# Patient Record
Sex: Male | Born: 1970 | Race: Black or African American | Hispanic: No | Marital: Married | Smoking: Current every day smoker
Health system: Southern US, Community
[De-identification: ages and names within clinical notes are randomized; demographics above are authoritative.]

## PROBLEM LIST (undated history)

## (undated) DIAGNOSIS — M549 Dorsalgia, unspecified: Secondary | ICD-10-CM

## (undated) DIAGNOSIS — G8929 Other chronic pain: Secondary | ICD-10-CM

## (undated) HISTORY — PX: SHOULDER OPEN ROTATOR CUFF REPAIR: SHX2407

## (undated) HISTORY — PX: OTHER SURGICAL HISTORY: SHX169

## (undated) HISTORY — PX: WRIST SURGERY: SHX841

---

## 2010-04-23 ENCOUNTER — Ambulatory Visit: Payer: Self-pay | Admitting: Thoracic Surgery

## 2010-05-14 ENCOUNTER — Inpatient Hospital Stay (HOSPITAL_COMMUNITY)
Admission: RE | Admit: 2010-05-14 | Discharge: 2010-05-21 | Payer: Self-pay | Source: Home / Self Care | Attending: Neurosurgery | Admitting: Neurosurgery

## 2010-05-20 LAB — COMPREHENSIVE METABOLIC PANEL
ALT: 19 U/L (ref 0–53)
ALT: 33 U/L (ref 0–53)
AST: 23 U/L (ref 0–37)
AST: 79 U/L — ABNORMAL HIGH (ref 0–37)
Albumin: 4.2 g/dL (ref 3.5–5.2)
Albumin: 2.4 g/dL — ABNORMAL LOW (ref 3.5–5.2)
Alkaline Phosphatase: 59 U/L (ref 39–117)
Alkaline Phosphatase: 36 U/L — ABNORMAL LOW (ref 39–117)
BUN: 12 mg/dL (ref 6–23)
BUN: 9 mg/dL (ref 6–23)
CO2: 25 mEq/L (ref 19–32)
CO2: 29 mEq/L (ref 19–32)
Calcium: 9.3 mg/dL (ref 8.4–10.5)
Calcium: 8.4 mg/dL (ref 8.4–10.5)
Chloride: 105 mEq/L (ref 96–112)
Chloride: 96 mEq/L (ref 96–112)
Creatinine, Ser: 1.08 mg/dL (ref 0.4–1.5)
Creatinine, Ser: 0.92 mg/dL (ref 0.4–1.5)
GFR calc Af Amer: >60 mL/min (ref >60)
GFR calc Af Amer: >60 mL/min (ref >60)
GFR calc non Af Amer: >60 mL/min (ref >60)
GFR calc non Af Amer: >60 mL/min (ref >60)
Glucose, Bld: 93 mg/dL (ref 70–99)
Glucose, Bld: 118 mg/dL — ABNORMAL HIGH (ref 70–99)
Potassium: 4.1 mEq/L (ref 3.5–5.1)
Potassium: 4.6 mEq/L (ref 3.5–5.1)
Sodium: 136 mEq/L (ref 135–145)
Sodium: 132 mEq/L — ABNORMAL LOW (ref 135–145)
Total Bilirubin: 0.5 mg/dL (ref 0.3–1.2)
Total Bilirubin: 0.5 mg/dL (ref 0.3–1.2)
Total Protein: 6 g/dL (ref 6.0–8.3)
Total Protein: 4.7 g/dL — ABNORMAL LOW (ref 6.0–8.3)

## 2010-05-20 LAB — PROTIME-INR
INR: 1.09 (ref 0.00–1.49)
INR: 1.31 (ref 0.00–1.49)
INR: 1.12 (ref 0.00–1.49)
Prothrombin Time: 14.3 seconds (ref 11.6–15.2)
Prothrombin Time: 16.5 seconds — ABNORMAL HIGH (ref 11.6–15.2)
Prothrombin Time: 14.6 seconds (ref 11.6–15.2)

## 2010-05-20 LAB — CBC
HCT: 42.9 % (ref 39.0–52.0)
HCT: 26.9 % — ABNORMAL LOW (ref 39.0–52.0)
HCT: 20.5 % — ABNORMAL LOW (ref 39.0–52.0)
HCT: 23.8 % — ABNORMAL LOW (ref 39.0–52.0)
HCT: 26.3 % — ABNORMAL LOW (ref 39.0–52.0)
HCT: 26.3 % — ABNORMAL LOW (ref 39.0–52.0)
HCT: 28.6 % — ABNORMAL LOW (ref 39.0–52.0)
Hemoglobin: 14.2 g/dL (ref 13.0–17.0)
Hemoglobin: 9 g/dL — ABNORMAL LOW (ref 13.0–17.0)
Hemoglobin: 6.8 g/dL — CL (ref 13.0–17.0)
Hemoglobin: 7.9 g/dL — ABNORMAL LOW (ref 13.0–17.0)
Hemoglobin: 8.8 g/dL — ABNORMAL LOW (ref 13.0–17.0)
Hemoglobin: 8.7 g/dL — ABNORMAL LOW (ref 13.0–17.0)
Hemoglobin: 9.4 g/dL — ABNORMAL LOW (ref 13.0–17.0)
MCH: 30.3 pg (ref 26.0–34.0)
MCH: 30.5 pg (ref 26.0–34.0)
MCH: 30.4 pg (ref 26.0–34.0)
MCH: 30.3 pg (ref 26.0–34.0)
MCH: 29.8 pg (ref 26.0–34.0)
MCH: 29.5 pg (ref 26.0–34.0)
MCH: 29.6 pg (ref 26.0–34.0)
MCHC: 33.1 g/dL (ref 30.0–36.0)
MCHC: 33.8 g/dL (ref 30.0–36.0)
MCHC: 33.2 g/dL (ref 30.0–36.0)
MCHC: 33.2 g/dL (ref 30.0–36.0)
MCHC: 33.5 g/dL (ref 30.0–36.0)
MCHC: 33.1 g/dL (ref 30.0–36.0)
MCHC: 32.9 g/dL (ref 30.0–36.0)
MCV: 91.5 fL (ref 78.0–100.0)
MCV: 90.3 fL (ref 78.0–100.0)
MCV: 91.5 fL (ref 78.0–100.0)
MCV: 91.2 fL (ref 78.0–100.0)
MCV: 89.2 fL (ref 78.0–100.0)
MCV: 89.2 fL (ref 78.0–100.0)
MCV: 89.9 fL (ref 78.0–100.0)
Platelets: 232 10*3/uL (ref 150–400)
Platelets: 207 10*3/uL (ref 150–400)
Platelets: 19 10*3/uL — CL (ref 150–400)
Platelets: 173 10*3/uL (ref 150–400)
Platelets: 137 10*3/uL — ABNORMAL LOW (ref 150–400)
Platelets: 187 10*3/uL (ref 150–400)
Platelets: 281 10*3/uL (ref 150–400)
RBC: 4.69 MIL/uL (ref 4.22–5.81)
RBC: 2.98 MIL/uL — ABNORMAL LOW (ref 4.22–5.81)
RBC: 2.24 MIL/uL — ABNORMAL LOW (ref 4.22–5.81)
RBC: 2.61 MIL/uL — ABNORMAL LOW (ref 4.22–5.81)
RBC: 2.95 MIL/uL — ABNORMAL LOW (ref 4.22–5.81)
RBC: 2.95 MIL/uL — ABNORMAL LOW (ref 4.22–5.81)
RBC: 3.18 MIL/uL — ABNORMAL LOW (ref 4.22–5.81)
RDW: 13.2 % (ref 11.5–15.5)
RDW: 13.2 % (ref 11.5–15.5)
RDW: 13.5 % (ref 11.5–15.5)
RDW: 13.4 % (ref 11.5–15.5)
RDW: 14.3 % (ref 11.5–15.5)
RDW: 13.9 % (ref 11.5–15.5)
RDW: 13.3 % (ref 11.5–15.5)
WBC: 5.7 10*3/uL (ref 4.0–10.5)
WBC: 16.2 10*3/uL — ABNORMAL HIGH (ref 4.0–10.5)
WBC: 10.4 10*3/uL (ref 4.0–10.5)
WBC: 12.3 10*3/uL — ABNORMAL HIGH (ref 4.0–10.5)
WBC: 14.6 10*3/uL — ABNORMAL HIGH (ref 4.0–10.5)
WBC: 13.5 10*3/uL — ABNORMAL HIGH (ref 4.0–10.5)
WBC: 7.6 10*3/uL (ref 4.0–10.5)

## 2010-05-20 LAB — TYPE AND SCREEN
ABO/RH(D): B POS
Antibody Screen: NEGATIVE
Unit division: 0
Unit division: 0

## 2010-05-20 LAB — URINALYSIS, ROUTINE W REFLEX MICROSCOPIC
Bilirubin Urine: NEGATIVE
Hgb urine dipstick: NEGATIVE
Ketones, ur: NEGATIVE mg/dL
Nitrite: NEGATIVE
Protein, ur: NEGATIVE mg/dL
Specific Gravity, Urine: 1.016 (ref 1.005–1.030)
Urine Glucose, Fasting: NEGATIVE mg/dL
Urobilinogen, UA: 1 mg/dL (ref 0.0–1.0)
pH: 7 (ref 5.0–8.0)

## 2010-05-20 LAB — BASIC METABOLIC PANEL
BUN: 15 mg/dL (ref 6–23)
BUN: 6 mg/dL (ref 6–23)
CO2: 28 mEq/L (ref 19–32)
CO2: 32 mEq/L (ref 19–32)
Calcium: 8.4 mg/dL (ref 8.4–10.5)
Calcium: 8.6 mg/dL (ref 8.4–10.5)
Chloride: 100 mEq/L (ref 96–112)
Chloride: 97 mEq/L (ref 96–112)
Creatinine, Ser: 1.28 mg/dL (ref 0.4–1.5)
Creatinine, Ser: 0.78 mg/dL (ref 0.4–1.5)
GFR calc Af Amer: >60 mL/min (ref >60)
GFR calc Af Amer: >60 mL/min (ref >60)
GFR calc non Af Amer: >60 mL/min (ref >60)
GFR calc non Af Amer: >60 mL/min (ref >60)
Glucose, Bld: 155 mg/dL — ABNORMAL HIGH (ref 70–99)
Glucose, Bld: 104 mg/dL — ABNORMAL HIGH (ref 70–99)
Potassium: 4.8 mEq/L (ref 3.5–5.1)
Potassium: 4.2 mEq/L (ref 3.5–5.1)
Sodium: 133 mEq/L — ABNORMAL LOW (ref 135–145)
Sodium: 133 mEq/L — ABNORMAL LOW (ref 135–145)

## 2010-05-20 LAB — SURGICAL PCR SCREEN
MRSA, PCR: NEGATIVE
Staphylococcus aureus: NEGATIVE

## 2010-05-20 LAB — BLOOD GAS, ARTERIAL
Acid-Base Excess: 2.9 mmol/L — ABNORMAL HIGH (ref 0.0–2.0)
Bicarbonate: 27.4 mEq/L — ABNORMAL HIGH (ref 20.0–24.0)
Drawn by: 181601
FIO2: 0.21 %
O2 Saturation: 97.5 %
Patient temperature: 98.6
TCO2: 28.8 mmol/L (ref 0–100)
pCO2 arterial: 45.6 mmHg — ABNORMAL HIGH (ref 35.0–45.0)
pH, Arterial: 7.396 (ref 7.350–7.450)
pO2, Arterial: 90.2 mmHg (ref 80.0–100.0)

## 2010-05-20 LAB — D-DIMER, QUANTITATIVE: D-Dimer, Quant: 1.4 ug/mL-FEU — ABNORMAL HIGH (ref 0.00–0.48)

## 2010-05-20 LAB — PREPARE PLATELETS: Unit division: 0

## 2010-05-20 LAB — APTT
aPTT: 32 seconds (ref 24–37)
aPTT: 32 seconds (ref 24–37)
aPTT: 20 seconds — ABNORMAL LOW (ref 24–37)

## 2010-05-20 LAB — ABO/RH: ABO/RH(D): B POS

## 2010-05-22 LAB — CBC
HCT: 29.6 % — ABNORMAL LOW (ref 39.0–52.0)
Hemoglobin: 10.1 g/dL — ABNORMAL LOW (ref 13.0–17.0)
MCH: 30 pg (ref 26.0–34.0)
MCHC: 34.1 g/dL (ref 30.0–36.0)
MCV: 87.8 fL (ref 78.0–100.0)
Platelets: 334 10*3/uL (ref 150–400)
RBC: 3.37 MIL/uL — ABNORMAL LOW (ref 4.22–5.81)
RDW: 13.2 % (ref 11.5–15.5)
WBC: 10.4 10*3/uL (ref 4.0–10.5)

## 2010-05-22 LAB — POCT I-STAT 4, (NA,K, GLUC, HGB,HCT)
Glucose, Bld: 135 mg/dL — ABNORMAL HIGH (ref 70–99)
HCT: 36 % — ABNORMAL LOW (ref 39.0–52.0)
Hemoglobin: 12.2 g/dL — ABNORMAL LOW (ref 13.0–17.0)
Potassium: 3.6 mEq/L (ref 3.5–5.1)
Sodium: 137 mEq/L (ref 135–145)

## 2010-05-22 LAB — APTT: aPTT: 33 seconds (ref 24–37)

## 2010-05-22 LAB — PROTIME-INR
INR: 1.15 (ref 0.00–1.49)
Prothrombin Time: 14.9 seconds (ref 11.6–15.2)
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
## 2010-05-29 ENCOUNTER — Encounter
Admission: RE | Admit: 2010-05-29 | Discharge: 2010-05-29 | Payer: Self-pay | Source: Home / Self Care | Attending: Thoracic Surgery | Admitting: Thoracic Surgery

## 2010-05-29 ENCOUNTER — Ambulatory Visit
Admission: RE | Admit: 2010-05-29 | Discharge: 2010-05-29 | Payer: Self-pay | Source: Home / Self Care | Attending: Thoracic Surgery | Admitting: Thoracic Surgery

## 2010-05-30 NOTE — Assessment & Plan Note (Addendum)
OFFICE VISIT  DRAY, DENTE DOB:  04-Feb-1971                                        May 29, 2010 CHART #:  [id number]  The patient returns today and his incisions are healing well.  We removed his chest tube sutures.  His chest x-ray shows some improvement with the right lower lobe atelectasis and small hemothorax.  I will see him again in 2 weeks with a chest x-ray.  His blood pressure is 142/83, pulse 100, respirations 20, sats were 97%.  Ines Bloomer, M.D. Electronically Signed  DPB/MEDQ  D:  05/29/2010  T:  05/30/2010  Job:  [id number]  cc:   Hilda Lias, M.D.

## 2010-06-06 ENCOUNTER — Ambulatory Visit
Admission: RE | Admit: 2010-06-06 | Discharge: 2010-06-06 | Disposition: A | Discharge status: Home / Self Care | Payer: Self-pay | Source: Ambulatory Visit | Attending: Thoracic Surgery | Admitting: Thoracic Surgery

## 2010-06-06 ENCOUNTER — Ambulatory Visit: Payer: Self-pay | Admitting: Thoracic Surgery

## 2010-06-06 ENCOUNTER — Other Ambulatory Visit: Payer: Self-pay | Admitting: Thoracic Surgery

## 2010-06-06 DIAGNOSIS — R0602 Shortness of breath: Secondary | ICD-10-CM

## 2010-06-11 ENCOUNTER — Other Ambulatory Visit: Payer: Self-pay | Admitting: Thoracic Surgery

## 2010-06-11 DIAGNOSIS — J90 Pleural effusion, not elsewhere classified: Secondary | ICD-10-CM

## 2010-06-12 ENCOUNTER — Ambulatory Visit: Payer: Medicare Other | Admitting: Thoracic Surgery

## 2010-07-02 ENCOUNTER — Other Ambulatory Visit: Payer: Self-pay | Admitting: Thoracic Surgery

## 2010-07-02 DIAGNOSIS — S271XXA Traumatic hemothorax, initial encounter: Secondary | ICD-10-CM

## 2010-07-03 ENCOUNTER — Ambulatory Visit: Payer: Self-pay | Admitting: Thoracic Surgery

## 2010-07-17 ENCOUNTER — Other Ambulatory Visit: Payer: Self-pay | Admitting: Thoracic Surgery

## 2010-07-17 ENCOUNTER — Ambulatory Visit: Payer: Self-pay | Admitting: Thoracic Surgery

## 2010-07-17 DIAGNOSIS — S271XXA Traumatic hemothorax, initial encounter: Secondary | ICD-10-CM

## 2010-07-23 ENCOUNTER — Other Ambulatory Visit: Payer: Self-pay | Admitting: Thoracic Surgery

## 2010-07-23 DIAGNOSIS — S271XXA Traumatic hemothorax, initial encounter: Secondary | ICD-10-CM

## 2010-07-24 ENCOUNTER — Ambulatory Visit
Admission: RE | Admit: 2010-07-24 | Discharge: 2010-07-24 | Disposition: A | Discharge status: Home / Self Care | Payer: Medicare Other | Source: Ambulatory Visit | Attending: Thoracic Surgery | Admitting: Thoracic Surgery

## 2010-07-24 ENCOUNTER — Ambulatory Visit (INDEPENDENT_AMBULATORY_CARE_PROVIDER_SITE_OTHER): Payer: Self-pay | Admitting: Thoracic Surgery

## 2010-07-24 DIAGNOSIS — M5124 Other intervertebral disc displacement, thoracic region: Secondary | ICD-10-CM

## 2010-07-24 DIAGNOSIS — S271XXA Traumatic hemothorax, initial encounter: Secondary | ICD-10-CM

## 2010-07-25 NOTE — Letter (Signed)
July 24, 2010  Arlan Organ, MD [address] [place], [place]  [address]  Re:  Gerald Burns, Gerald Burns              DOB:  03/10/1971  Dear Dr. Martha Clan,  I saw the patient for last followup approximately 3 months ago.  In early January, Dr. Jeral Fruit did a right thoracotomy for T3-4 disk disease. His followup was relatively benign but he did have a lot of reaction being the bases of his chest and he came back today for final followup. Apparently he had a CT scan done last week and I reviewed it nicely no problems with the CT scan other than what normally see postoperatively. As mentioned, there has been evidence of some scarring on his followup chest x-rays, and the chest x-ray today showed that this is markedly decreased, so my standpoint I released him back to full activities, I do not need to see him any more today.  Gerald Burns, M.D. Electronically Signed  DPB/MEDQ  D:  07/24/2010  T:  07/25/2010  Job:  [id number]  cc:   Hilda Lias, M.D.
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
## 2012-04-26 IMAGING — CR DG THORACOLUMBAR SPINE 2V
1 series · 1 of 1 positions shown · non-contrast
Comparison: Intraoperative portable exam 7744 hours compared to
chest radiograph of 05/14/2010

CLINICAL DATA: Thoracic disc herniation without myelopathy,
thoracic pain, right side approach

THORACOLUMBAR SPINE - 2 VIEW

[view not recorded]
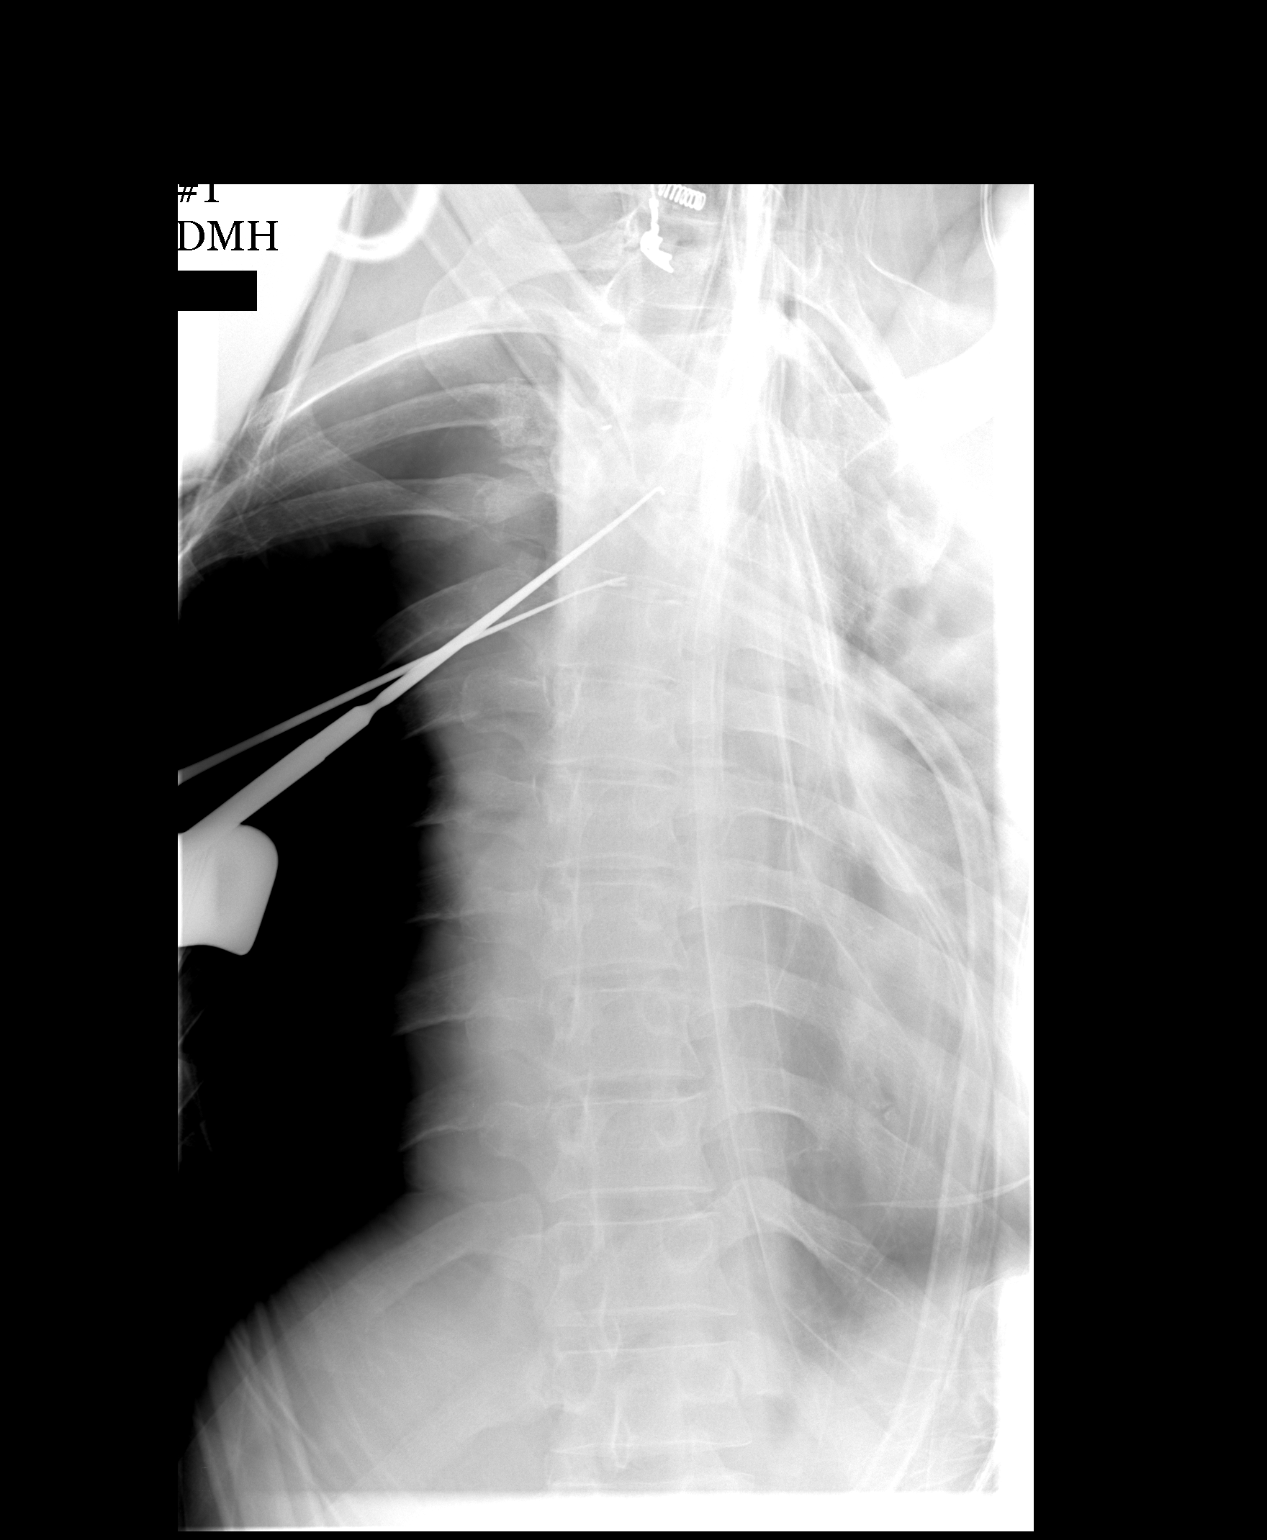

[1 of 1 positions shown; findings below may reference images not displayed]

FINDINGS: Examination interpreted postoperatively.
Tip of endotracheal tube approximately 6.5 cm above carina.
Two metallic probes are identified from a right lateral hemithorax
approach, tips projecting at the T3-T4 and T4-T5 disc spaces.
Interval right thoracotomy changes.
Bedding artifacts project over the thorax.
IMPRESSION: Metallic probes localize the T3-T4 and T4-T5 disc spaces on an
intraoperative exam.

## 2012-04-27 IMAGING — CR DG CHEST 1V PORT
1 series · 1 of 1 positions shown · non-contrast
Comparison: 05/14/2010.

CLINICAL DATA: Thoracic pain.  Post thoracic surgery.  Chest tube
is in place.

PORTABLE CHEST - 1 VIEW

[view not recorded]
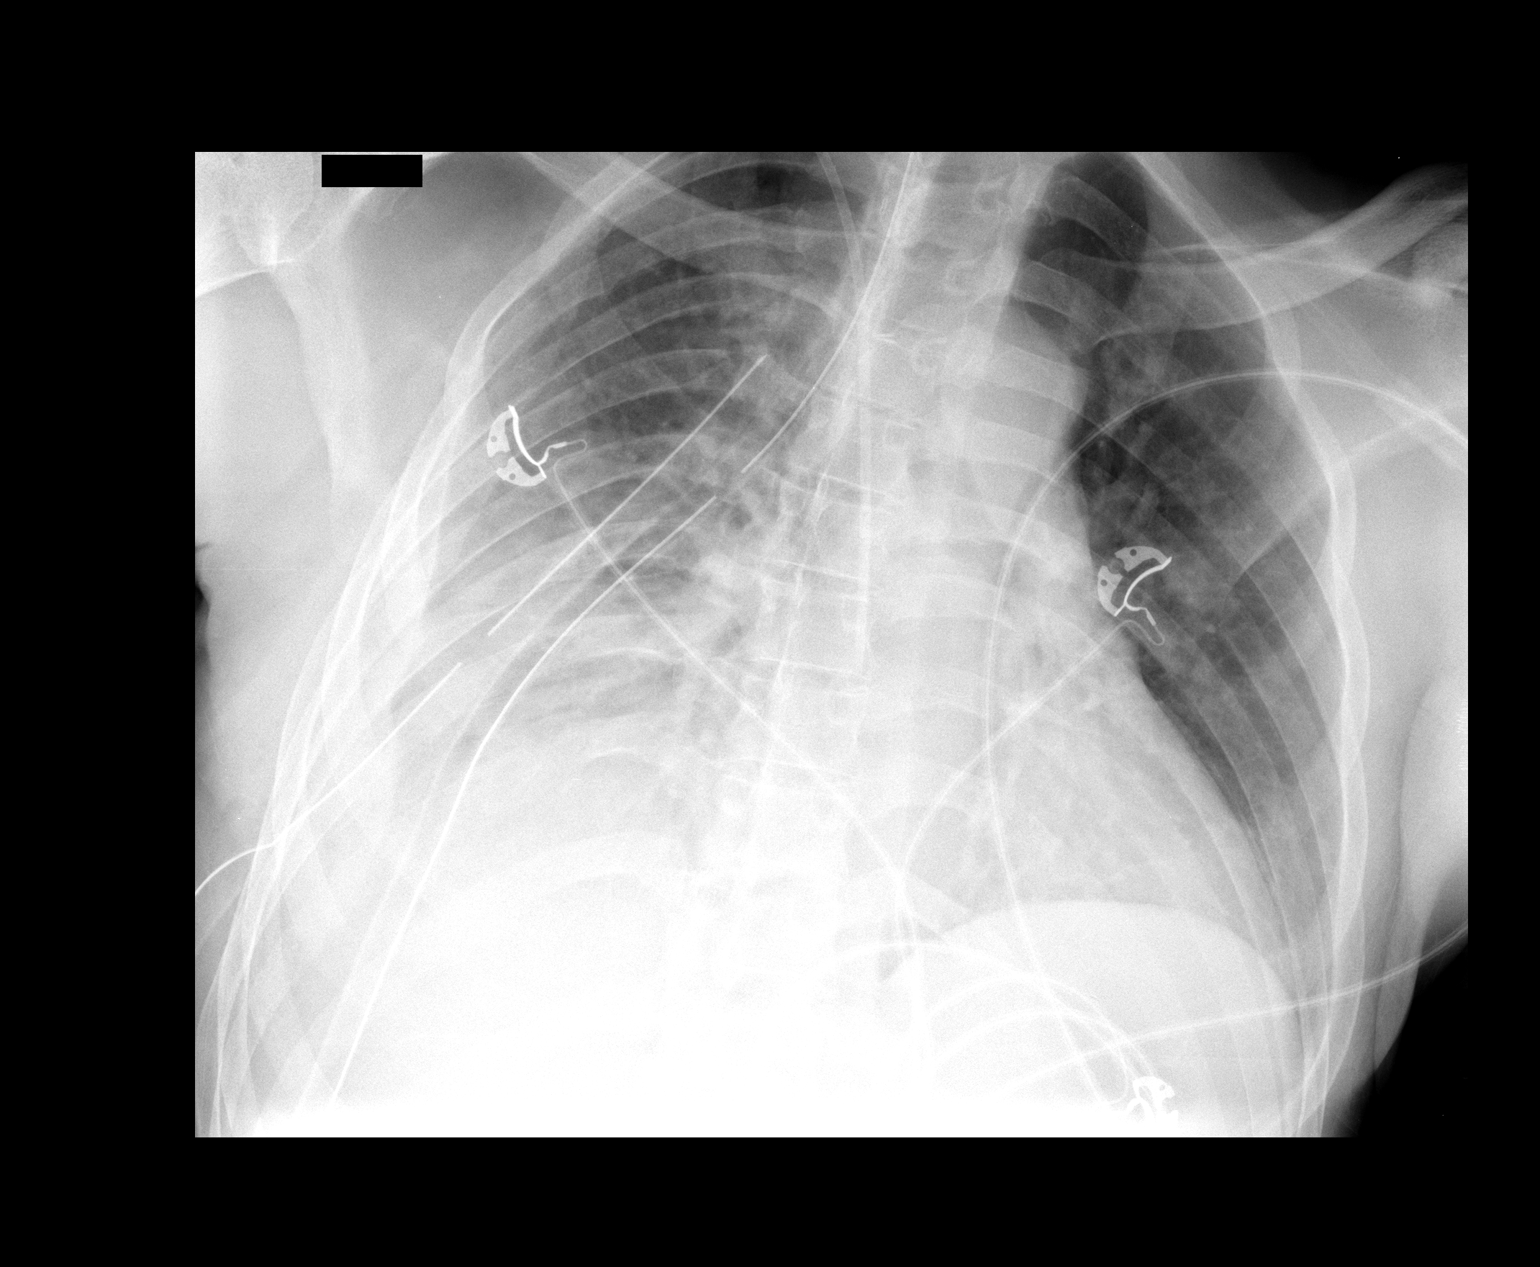

[1 of 1 positions shown; findings below may reference images not displayed]

FINDINGS: Right internal jugular venous catheter tip terminates in
distal SVC at cavoatrial junction.  Two right chest tubes are in
place.  No pneumothorax is evident.  Normal heart size.  Mild
vascular congestion pattern with improvement on the left since
previous study.  Atelectasis and infiltrative densities in right
mid and lower lung zones.  Stable appearance of right pleural
effusion and / or thickening.
IMPRESSION: Central venous catheter and chest tube are in place on the right.
No pneumothorax.  Improved vascular congestion pattern on left.
Atelectasis and infiltrative densities in right mid and lower lung
zones are seen.  There is slight increase in the density in the
lower right hemithorax.  There is associated right pleural effusion
and / or thickening.

## 2012-05-01 IMAGING — CR DG CHEST 1V PORT
1 series · 1 of 1 positions shown · non-contrast
Comparison: 05/18/2010

CLINICAL DATA: Chest tube removal

PORTABLE CHEST - 1 VIEW

[view not recorded]
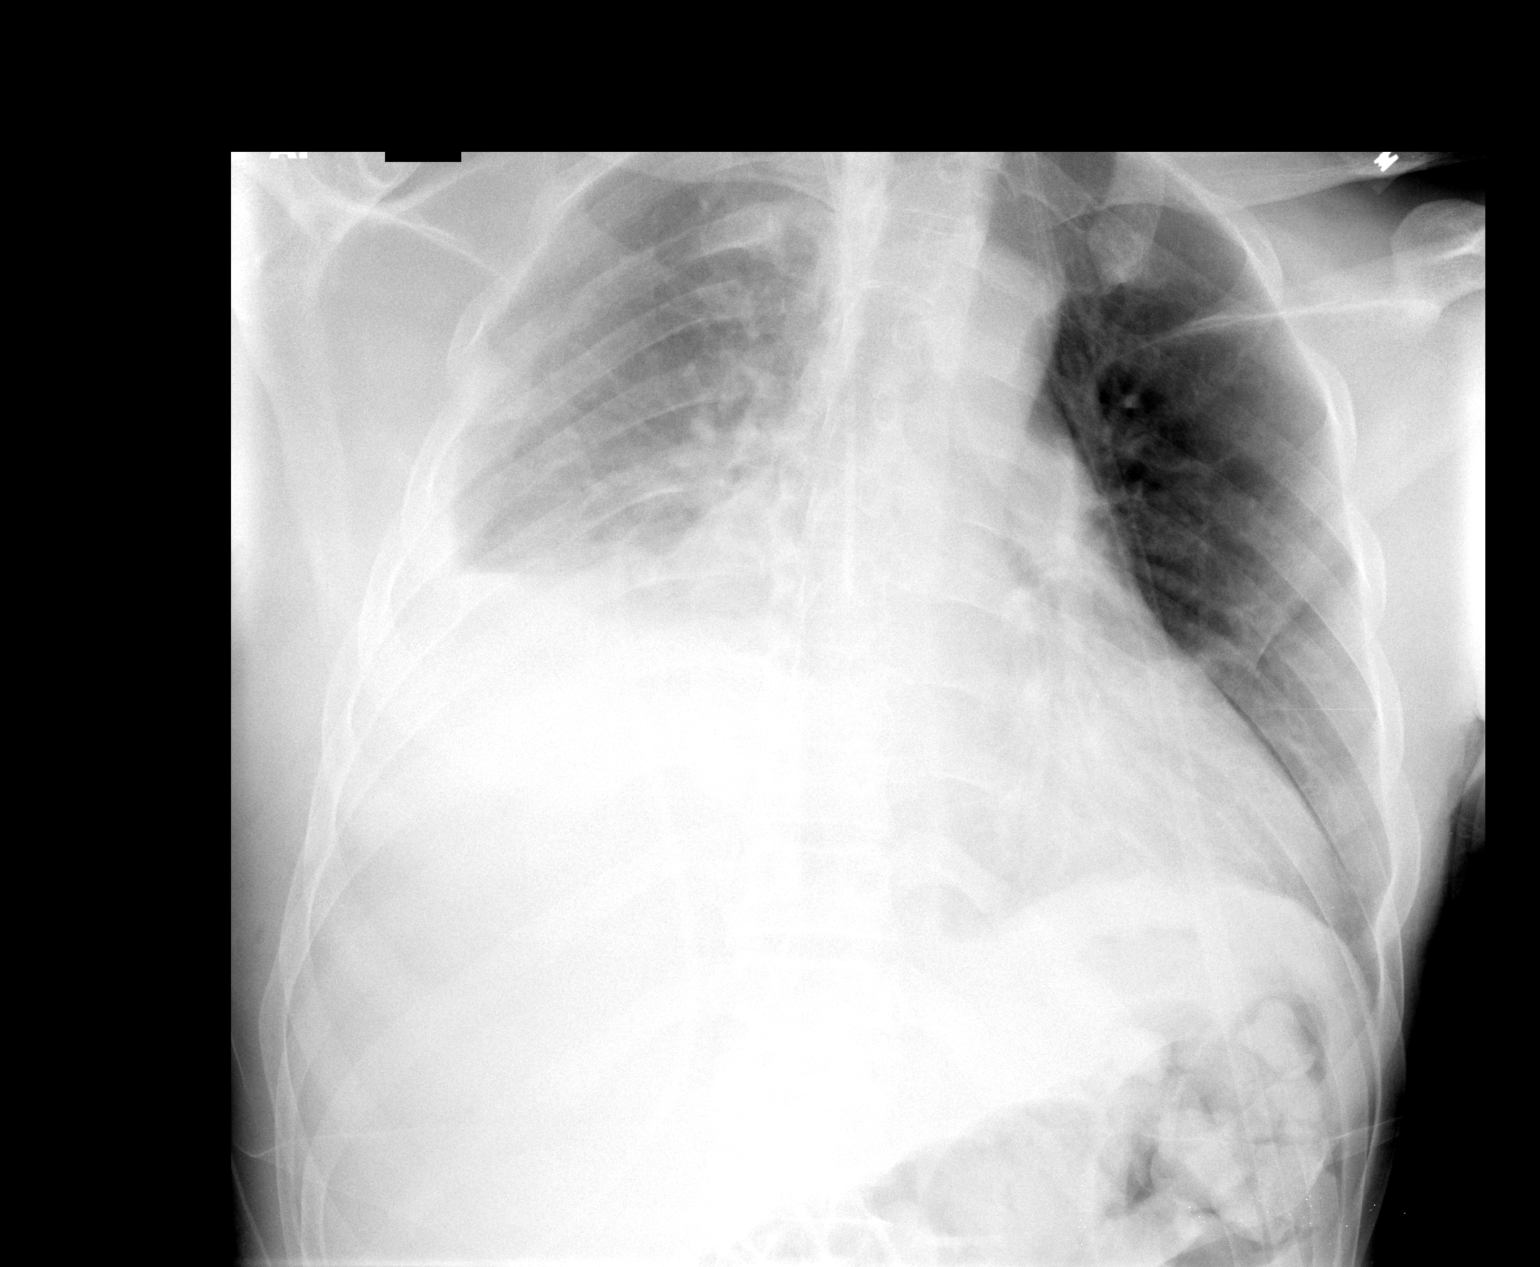

[1 of 1 positions shown; findings below may reference images not displayed]

FINDINGS: Interval removal of the right-sided chest tube.  No
pneumothorax is present.  A moderate to large right pleural
effusion is again noted and there is increased atelectasis seen at
the right lung base.  The left lung is unchanged.  The heart,
osseous structures and upper abdomen are also unchanged.
IMPRESSION: Interval removal of the right-sided chest tube without
pneumothorax.  Increasing volume loss the right lung base with a
moderate to large pleural effusion.

## 2012-05-02 IMAGING — CR DG CHEST 2V
2 series · 2 of 2 positions shown · non-contrast
Comparison: 05/19/2009

CLINICAL DATA: Thoracic disc protrusion without myelopathy.
Postop.

CHEST - 2 VIEW

[w chest pa]
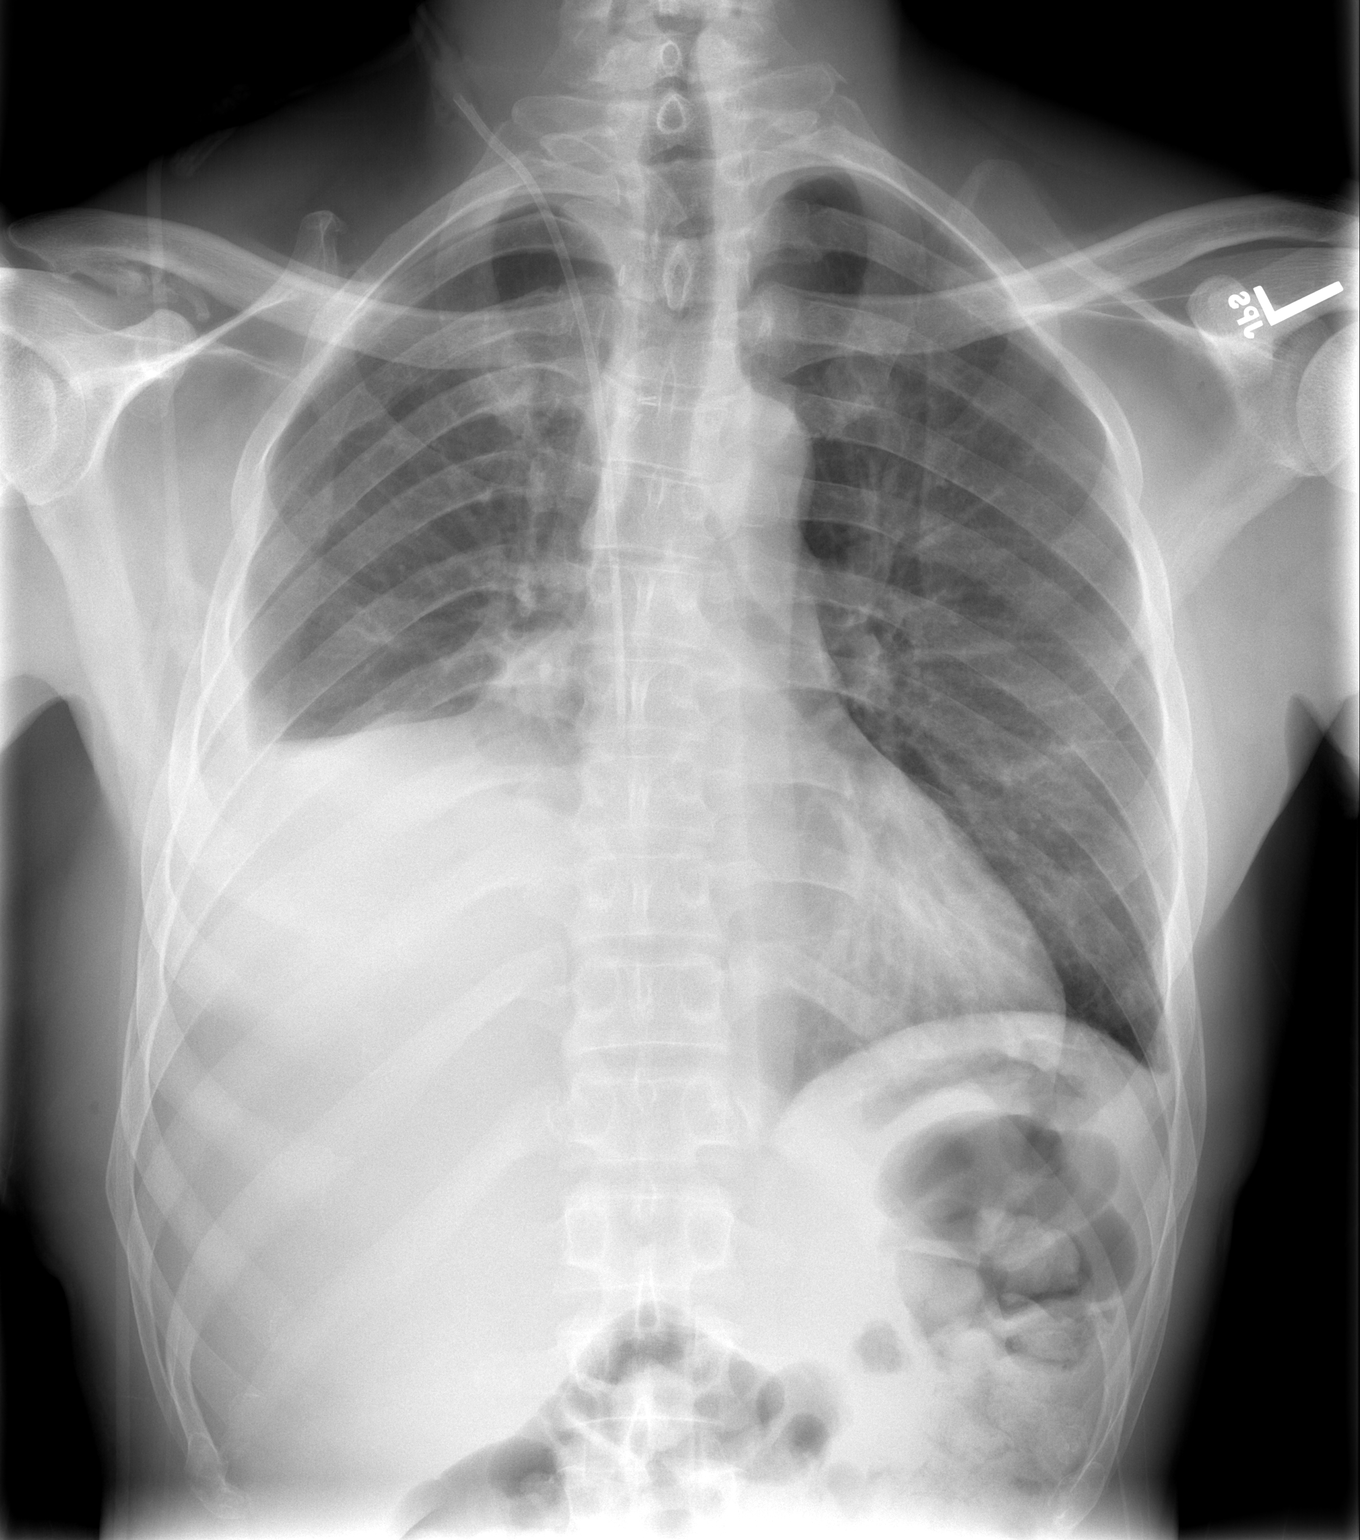

[w chest lat *]
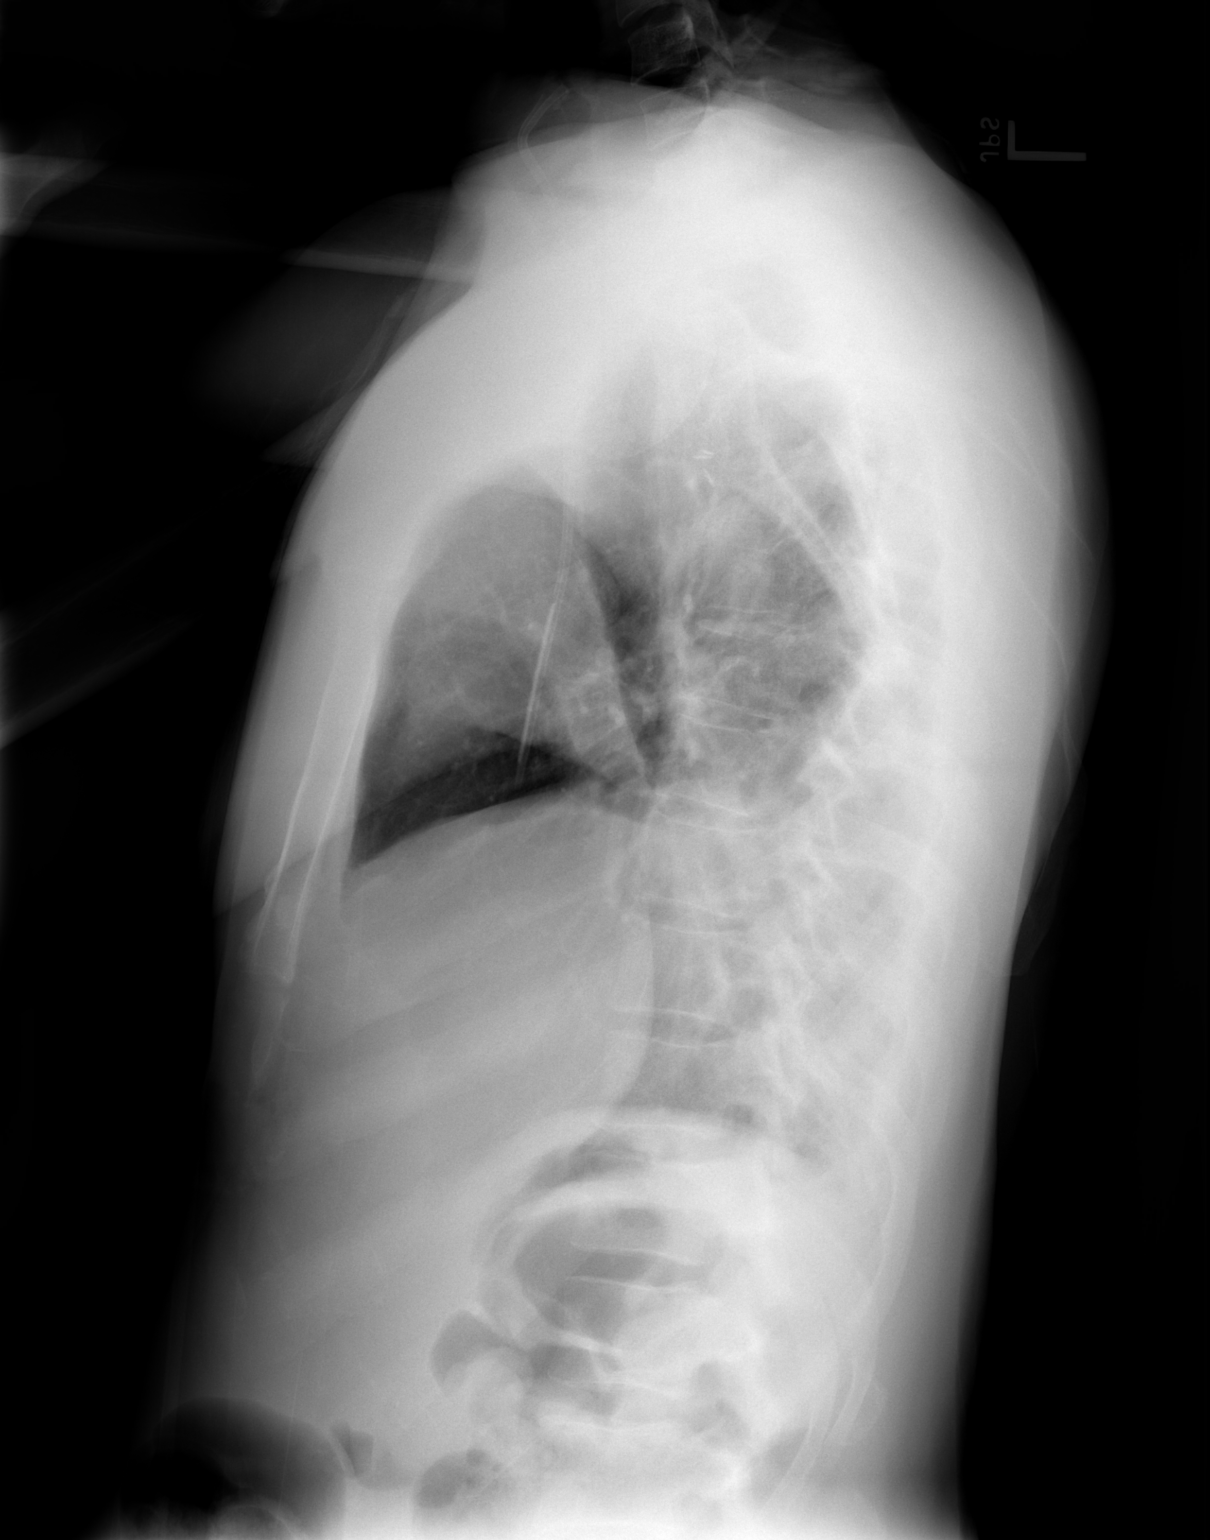

[2 of 2 positions shown; findings below may reference images not displayed]

FINDINGS: Large right pleural effusion.  Compressive atelectasis
right base.  Cannot exclude right basilar infiltrate.  Central line
good position.  No visible pneumothorax.  Normal heart size.  Left
lung essentially clear.  No significant bony abnormality.  There
may be slight improvement aeration compared with priors.
IMPRESSION: No pneumothorax.  Slight improvement aeration.

## 2014-07-17 ENCOUNTER — Encounter (HOSPITAL_COMMUNITY): Payer: Self-pay | Admitting: *Deleted

## 2014-07-17 ENCOUNTER — Emergency Department (INDEPENDENT_AMBULATORY_CARE_PROVIDER_SITE_OTHER)
Admission: EM | Admit: 2014-07-17 | Discharge: 2014-07-17 | Disposition: A | Discharge status: Home / Self Care | Payer: Medicare Other | Source: Home / Self Care | Attending: Family Medicine | Admitting: Family Medicine

## 2014-07-17 DIAGNOSIS — G894 Chronic pain syndrome: Secondary | ICD-10-CM

## 2014-07-17 HISTORY — DX: Other chronic pain: G89.29

## 2014-07-17 HISTORY — DX: Dorsalgia, unspecified: M54.9

## 2014-07-17 MED ORDER — DIAZEPAM 5 MG PO TABS: 5.0000 mg | ORAL_TABLET | Freq: Four times a day (QID) | ORAL | Status: DC | PRN

## 2014-07-17 MED ORDER — OXYCODONE-ACETAMINOPHEN 5-325 MG PO TABS: 1.0000 | ORAL_TABLET | Freq: Four times a day (QID) | ORAL | Status: DC | PRN

## 2014-07-17 NOTE — Discharge Instructions (Signed)
See pain management doctor for further medicine refills.
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
## 2014-07-17 NOTE — ED Notes (Addendum)
C/o chronic back pain for 7 yrs.  His doctor retired and is supposed to be going to pain management clinic but has run out pain medicine.  [facility]24.

## 2016-01-26 ENCOUNTER — Encounter (HOSPITAL_COMMUNITY): Payer: Self-pay | Admitting: Emergency Medicine

## 2016-01-26 ENCOUNTER — Emergency Department (HOSPITAL_COMMUNITY)
Admission: EM | Admit: 2016-01-26 | Discharge: 2016-01-26 | Disposition: A | Discharge status: Home / Self Care | Payer: Medicare Other | Attending: Emergency Medicine | Admitting: Emergency Medicine

## 2016-01-26 DIAGNOSIS — Z7982 Long term (current) use of aspirin: Secondary | ICD-10-CM | POA: Diagnosis not present

## 2016-01-26 DIAGNOSIS — Z79899 Other long term (current) drug therapy: Secondary | ICD-10-CM | POA: Insufficient documentation

## 2016-01-26 DIAGNOSIS — T40601A Poisoning by unspecified narcotics, accidental (unintentional), initial encounter: Secondary | ICD-10-CM

## 2016-01-26 DIAGNOSIS — F17200 Nicotine dependence, unspecified, uncomplicated: Secondary | ICD-10-CM | POA: Insufficient documentation

## 2016-01-26 DIAGNOSIS — T402X1A Poisoning by other opioids, accidental (unintentional), initial encounter: Secondary | ICD-10-CM | POA: Diagnosis present

## 2016-01-26 LAB — BASIC METABOLIC PANEL
Anion gap: 7 (ref 5–15)
BUN: 16 mg/dL (ref 6–20)
CHLORIDE: 104 mmol/L (ref 101–111)
CO2: 25 mmol/L (ref 22–32)
CREATININE: 1.16 mg/dL (ref 0.61–1.24)
Calcium: 9 mg/dL (ref 8.9–10.3)
Glucose, Bld: 113 mg/dL — ABNORMAL HIGH (ref 65–99)
POTASSIUM: 4.6 mmol/L (ref 3.5–5.1)
SODIUM: 136 mmol/L (ref 135–145)

## 2016-01-26 LAB — CBC WITH DIFFERENTIAL/PLATELET
BASOS PCT: 1 %
Basophils Absolute: 0 10*3/uL (ref 0.0–0.1)
EOS ABS: 0.2 10*3/uL (ref 0.0–0.7)
Eosinophils Relative: 4 %
HCT: 43.4 % (ref 39.0–52.0)
HEMOGLOBIN: 14.5 g/dL (ref 13.0–17.0)
Lymphocytes Relative: 41 %
Lymphs Abs: 2.5 10*3/uL (ref 0.7–4.0)
MCH: 29.3 pg (ref 26.0–34.0)
MCHC: 33.4 g/dL (ref 30.0–36.0)
MCV: 87.7 fL (ref 78.0–100.0)
Monocytes Absolute: 0.8 10*3/uL (ref 0.1–1.0)
Monocytes Relative: 13 %
NEUTROS PCT: 41 %
Neutro Abs: 2.5 10*3/uL (ref 1.7–7.7)
Platelets: 282 10*3/uL (ref 150–400)
RBC: 4.95 MIL/uL (ref 4.22–5.81)
RDW: 14.4 % (ref 11.5–15.5)
WBC: 6 10*3/uL (ref 4.0–10.5)
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
## 2016-01-26 NOTE — ED Notes (Signed)
Pt is still drowsy while talking to Clinical research associatewriter, Clinical research associatewriter requested pt for a urine sample, urinal at bedside.
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
## 2016-01-26 NOTE — Discharge Instructions (Signed)
Substance Abuse Treatment Programs ° °Intensive Outpatient Programs °[facility]     °[address]      °[place], [place]                   °[phone]      ° °[facility] °[address] °[place], [place] °[phone] ° °[facility]     °(Inpatient and outpatient)     °[address]           °[phone]   ° °[facility] °336-288-1484 (Suboxone and Methadone) ° °[address]      °[place], [place] [address]      °336-882-2125      ° °[address] °[place], [place] °852-3033 ° °[facility] (Outpatient/Inpatient, Chemical)    °(insurance only) [phone]      °       °[facility] (Groups & Residential) °[place], [place] °336-389-1413 ° °   °[facility]     °[address]     °[place], [place]      °336-389-1413      ° °[facility] (for caregivers and family) °[address] °[place], [place] °336-299-4655 ° ° ° ° ° °Residential Treatment Programs °[facility]      °[address], [place], [place] [address]  °([phone]      ° °[facility] °[address] [place], [place] [address] °[phone] ° °[facility]        °336-248-8914      ° °[facility] °[phone] ° °[facility]             °[address]                                         °[place], [place]                                                °[phone] or 336-784-9470                              ° °[facility] [place] °[address] °[place], [address] °[phone] ° °[facility]    °[address]      °[place], [place]     °[phone]      ° °[facility] °[address] °, [place] °336-285-9073 ° °[facility]   °[address]     °[place], [place] [address]     °336-899-1550      °Admissions: 8am-3pm M-F ° °[facility] °[address] °[place],  [place] °336-227-7417 ° °[facility]: Residential Program (90 Days)   °[place], [place]      °336-725-8389 or 800-758-6077    ° °[facility] °[place], [place] °(Walk in Hours over the weekend or by referral) ° °[facility] °[address], [place], [place] [address] °([phone]) [phone] ° °Crisis Mobile: [facility]:  1-877-626-1772 (for crisis response 24 hours a day) °[facility] Hotline:      1-800-256-2452 °Outpatient Psychiatry and Counseling ° °[facility]: Mobile Crisis   Management 24 hours:  1-877-626-1772 ° °[facility] sliding scale fee and walk in schedule: M-F 8am-12pm/1pm-3pm °[address]  °[place], [place] [address] °[phone] ° °[facility] °[address] °[place], [place] [address] °336-703-9650 ° °[facility] ([facility])- new patient walk-in appointments available Monday - Friday 8am -3pm.          °[address] °[place], [place] [address] °336-676-6840 or crisis line- 336-676-6905 ° °[facility] Intensive Outpatient Therapy Program °[address] °[place], [place] [address] °336-832-9804 ° °[facility]                  °Crisis Services      °[phone]      °[address]     °[place], [place] [address]                ° °[facility]   °[facility] °[phone] °[address] °[place], [place] [address] ° ° °[facility]          °[address]  °[place], [place] [address]       °([phone] ° °[facility] °[address] °[place], [place] [address] °336-292-1510 ° °[facility]    °[address]    °[place], [place] [address]     °[phone]      ° °Parish McKinney, MD     °[address]     °[place] [address]     °336-282-1251     °  °[facility] °[address] °[place] [address] ° °[facility]     °[address]     °[place], [place]      °336-542-2076      ° °[facility] °Shamsher Ahluwalia, MD °[address] °[place], [place] [address] °336-420-9558 ° °[facility]     °[address]     °[place], [place] [address]     °336-274-1237      ° °[facility] °[address] °[place], [place] [address] °336-854-4450 °Resources for Temporary Residential Assistance/Crisis Centers ° °DAY CENTERS °[facility] °M-F 8am-3pm   °[address] [place], [place] [address]   336-332-0824 °Services include: laundry, barbering, support groups, case management, phone  & computer access, showers, AA/NA mtgs, mental health/substance abuse nurse, job skills class, disability information, VA assistance, spiritual classes, etc.  ° °HOMELESS SHELTERS ° °[facility]     °[facility]   °[address], [place]     °[phone]       °       °[facility] (women and children)       °[address] °[place], [place] [address] °336-275-0820 °[email or web address] for application and process °Application Required ° °[facility]   °[address]    °[place] [address]     °[phone]       °             °[facility] °[address] °[place], [place] [address] °336.273.5572 °336-235-0363(schedule application appt.) °Application Required ° °[facility] (women only)    °[address]     °[place], [place] [address]     °336-884-1039      °  Intake starts 6pm daily °Need valid ID, SSC, & Police report °[facility] [place] °[address] °[place], [place] °336-881-5420 °Application Required ° °[facility] (men only)     °[address]      °[place], [place]     °[phone]      ° °[facility] °(Pregnant women only) °[address] °[place], [place] °336-275-0206 ° °[facility]      °[address]      °[place], [place] [address]     °[phone]      °       °[facility] °[address] °[place], [place] °336-723-1848 °90 day commitment/SA/Application process ° °[facility](men only)     °[address]     °[place], [place]     °[phone]       °Check-in at 7pm     °       °[facility] °[address] °[place], [place] [address] °336-248-6684 °Men/Women/Women and Children must be there by 7 pm ° °[facility] °[place] °336-722-8721                ° °

## 2018-06-07 DIAGNOSIS — M50123 Cervical disc disorder at C6-C7 level with radiculopathy: Secondary | ICD-10-CM | POA: Diagnosis not present

## 2018-06-07 DIAGNOSIS — R202 Paresthesia of skin: Secondary | ICD-10-CM | POA: Diagnosis not present

## 2018-06-07 DIAGNOSIS — M50323 Other cervical disc degeneration at C6-C7 level: Secondary | ICD-10-CM | POA: Diagnosis not present

## 2018-06-10 DIAGNOSIS — M7541 Impingement syndrome of right shoulder: Secondary | ICD-10-CM | POA: Diagnosis not present

## 2018-06-10 DIAGNOSIS — S43101S Unspecified dislocation of right acromioclavicular joint, sequela: Secondary | ICD-10-CM | POA: Diagnosis not present

## 2018-06-10 DIAGNOSIS — M25511 Pain in right shoulder: Secondary | ICD-10-CM | POA: Diagnosis not present

## 2018-06-14 DIAGNOSIS — M7918 Myalgia, other site: Secondary | ICD-10-CM | POA: Diagnosis not present

## 2018-06-14 DIAGNOSIS — M5489 Other dorsalgia: Secondary | ICD-10-CM | POA: Diagnosis not present

## 2018-06-14 DIAGNOSIS — M25511 Pain in right shoulder: Secondary | ICD-10-CM | POA: Diagnosis not present

## 2018-06-17 DIAGNOSIS — L0291 Cutaneous abscess, unspecified: Secondary | ICD-10-CM | POA: Diagnosis not present

## 2018-06-17 DIAGNOSIS — R51 Headache: Secondary | ICD-10-CM | POA: Diagnosis not present

## 2018-06-18 DIAGNOSIS — L723 Sebaceous cyst: Secondary | ICD-10-CM | POA: Diagnosis not present

## 2018-06-18 DIAGNOSIS — L0291 Cutaneous abscess, unspecified: Secondary | ICD-10-CM | POA: Diagnosis not present

## 2018-06-18 DIAGNOSIS — L089 Local infection of the skin and subcutaneous tissue, unspecified: Secondary | ICD-10-CM | POA: Diagnosis not present

## 2018-06-18 DIAGNOSIS — L02811 Cutaneous abscess of head [any part, except face]: Secondary | ICD-10-CM | POA: Diagnosis not present

## 2018-06-18 DIAGNOSIS — Z6822 Body mass index (BMI) 22.0-22.9, adult: Secondary | ICD-10-CM | POA: Diagnosis not present

## 2018-06-22 DIAGNOSIS — M7918 Myalgia, other site: Secondary | ICD-10-CM | POA: Diagnosis not present

## 2018-06-22 DIAGNOSIS — M5489 Other dorsalgia: Secondary | ICD-10-CM | POA: Diagnosis not present

## 2018-06-22 DIAGNOSIS — M25511 Pain in right shoulder: Secondary | ICD-10-CM | POA: Diagnosis not present

## 2018-06-22 DIAGNOSIS — L723 Sebaceous cyst: Secondary | ICD-10-CM | POA: Diagnosis not present

## 2018-06-22 DIAGNOSIS — L089 Local infection of the skin and subcutaneous tissue, unspecified: Secondary | ICD-10-CM | POA: Diagnosis not present

## 2018-06-29 DIAGNOSIS — M5489 Other dorsalgia: Secondary | ICD-10-CM | POA: Diagnosis not present

## 2018-06-29 DIAGNOSIS — M25511 Pain in right shoulder: Secondary | ICD-10-CM | POA: Diagnosis not present

## 2018-06-29 DIAGNOSIS — L723 Sebaceous cyst: Secondary | ICD-10-CM | POA: Diagnosis not present

## 2018-06-29 DIAGNOSIS — M7918 Myalgia, other site: Secondary | ICD-10-CM | POA: Diagnosis not present

## 2018-06-29 DIAGNOSIS — L089 Local infection of the skin and subcutaneous tissue, unspecified: Secondary | ICD-10-CM | POA: Diagnosis not present

## 2018-07-06 DIAGNOSIS — M25511 Pain in right shoulder: Secondary | ICD-10-CM | POA: Diagnosis not present

## 2018-07-06 DIAGNOSIS — M7918 Myalgia, other site: Secondary | ICD-10-CM | POA: Diagnosis not present

## 2018-07-06 DIAGNOSIS — M5489 Other dorsalgia: Secondary | ICD-10-CM | POA: Diagnosis not present

## 2018-07-13 DIAGNOSIS — M25511 Pain in right shoulder: Secondary | ICD-10-CM | POA: Diagnosis not present

## 2018-07-13 DIAGNOSIS — M7918 Myalgia, other site: Secondary | ICD-10-CM | POA: Diagnosis not present

## 2018-07-13 DIAGNOSIS — M5489 Other dorsalgia: Secondary | ICD-10-CM | POA: Diagnosis not present

## 2018-07-20 DIAGNOSIS — M7918 Myalgia, other site: Secondary | ICD-10-CM | POA: Diagnosis not present

## 2018-07-20 DIAGNOSIS — M25511 Pain in right shoulder: Secondary | ICD-10-CM | POA: Diagnosis not present

## 2018-07-20 DIAGNOSIS — M5489 Other dorsalgia: Secondary | ICD-10-CM | POA: Diagnosis not present

## 2018-07-23 DIAGNOSIS — M25511 Pain in right shoulder: Secondary | ICD-10-CM | POA: Diagnosis not present

## 2018-08-03 DIAGNOSIS — M25511 Pain in right shoulder: Secondary | ICD-10-CM | POA: Diagnosis not present

## 2018-08-03 DIAGNOSIS — M7918 Myalgia, other site: Secondary | ICD-10-CM | POA: Diagnosis not present

## 2018-08-03 DIAGNOSIS — M5489 Other dorsalgia: Secondary | ICD-10-CM | POA: Diagnosis not present

## 2018-11-24 DIAGNOSIS — Z653 Problems related to other legal circumstances: Secondary | ICD-10-CM | POA: Diagnosis not present

## 2018-11-24 DIAGNOSIS — F1520 Other stimulant dependence, uncomplicated: Secondary | ICD-10-CM | POA: Diagnosis not present

## 2019-06-16 DIAGNOSIS — Z20828 Contact with and (suspected) exposure to other viral communicable diseases: Secondary | ICD-10-CM | POA: Diagnosis not present

## 2019-06-16 DIAGNOSIS — R0981 Nasal congestion: Secondary | ICD-10-CM | POA: Diagnosis not present

## 2020-04-11 DIAGNOSIS — Z20822 Contact with and (suspected) exposure to covid-19: Secondary | ICD-10-CM | POA: Diagnosis not present

## 2020-04-11 DIAGNOSIS — Z7982 Long term (current) use of aspirin: Secondary | ICD-10-CM | POA: Diagnosis not present

## 2021-05-09 DIAGNOSIS — R112 Nausea with vomiting, unspecified: Secondary | ICD-10-CM | POA: Diagnosis not present

## 2021-05-09 DIAGNOSIS — U071 COVID-19: Secondary | ICD-10-CM | POA: Diagnosis not present
